# Patient Record
Sex: Male | Born: 1965 | Race: White | Hispanic: No | Smoking: Current some day smoker
Health system: Southern US, Community
[De-identification: ages and names within clinical notes are randomized; demographics above are authoritative.]

## PROBLEM LIST (undated history)

## (undated) DIAGNOSIS — F1020 Alcohol dependence, uncomplicated: Secondary | ICD-10-CM

---

## 2006-07-24 ENCOUNTER — Other Ambulatory Visit: Payer: Self-pay

## 2006-07-24 ENCOUNTER — Inpatient Hospital Stay: Payer: Self-pay | Admitting: Internal Medicine

## 2007-02-27 ENCOUNTER — Other Ambulatory Visit: Payer: Self-pay

## 2007-02-27 ENCOUNTER — Emergency Department: Payer: Self-pay | Admitting: Emergency Medicine
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
## 2008-10-30 ENCOUNTER — Inpatient Hospital Stay: Payer: Self-pay | Admitting: Internal Medicine

## 2009-12-14 IMAGING — CR DG CHEST 2V
1 series · 2 of 2 positions shown · non-contrast
Comparison: none

REASON FOR EXAM: Hypoxia
COMMENTS:

PROCEDURE:  DXR CHEST PA (OR AP) AND LATERAL  - November 01, 2008
RESULT:      PA and lateral views of the chest shows the lung fields to the
clear. No pneumonia, pneumothorax or pleural effusion is seen. The heart
size is normal.

[Series 1: view not recorded · 0.17mm/px · 2 of 2 slices shown]
[im 1/2]
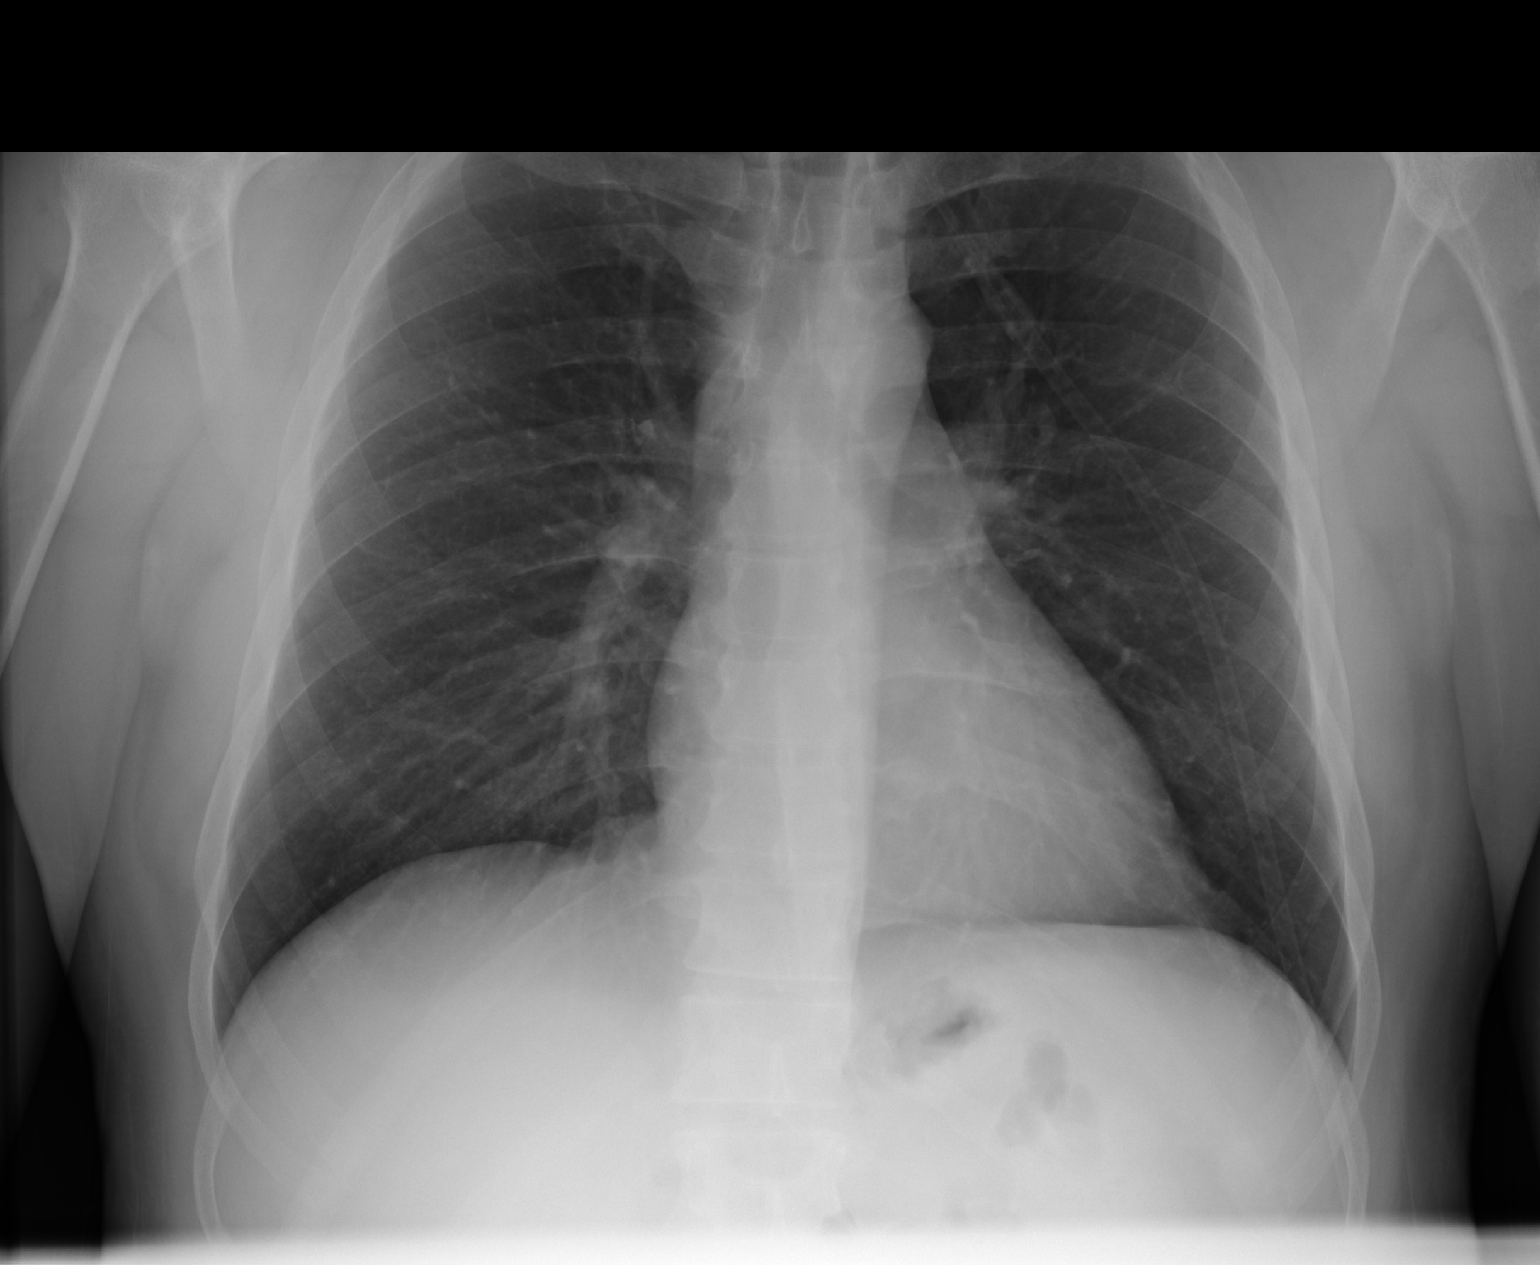
[im 2/2]
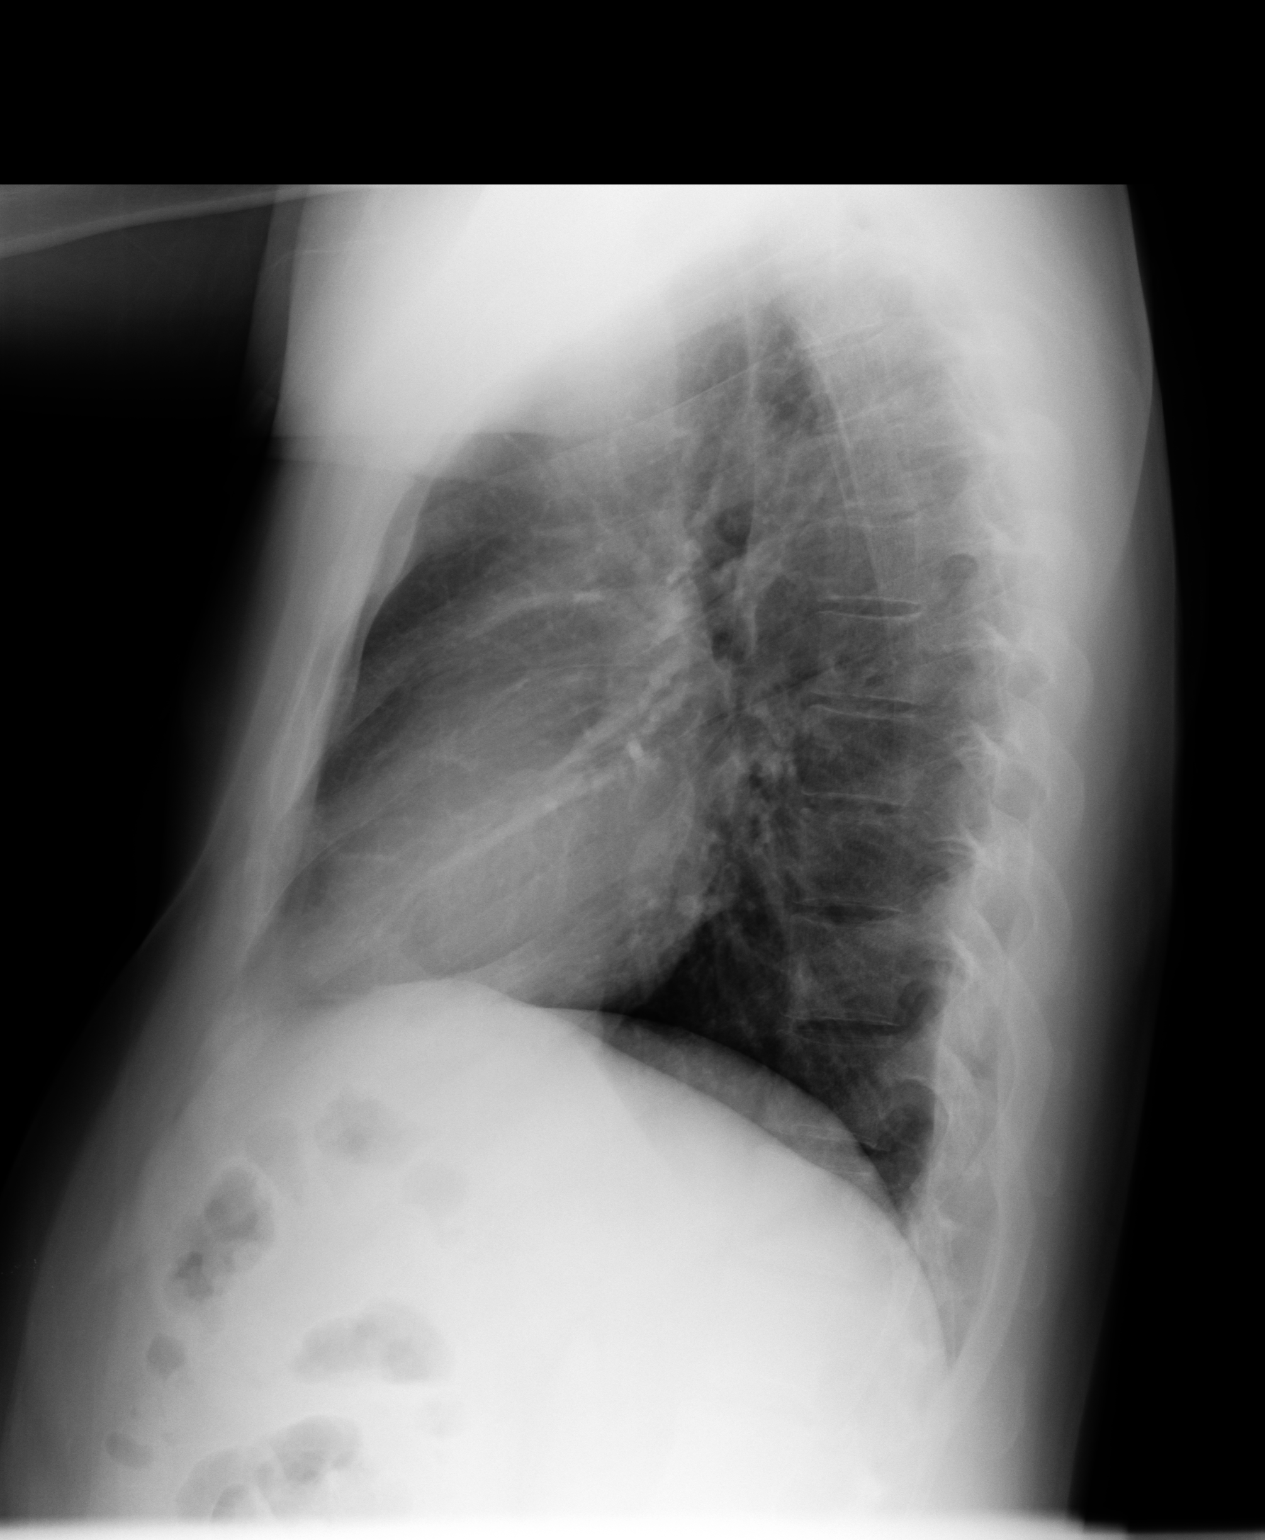

[2 of 2 positions shown; findings below may reference images not displayed]

IMPRESSION: No significant abnormalities are noted.

## 2011-07-14 ENCOUNTER — Inpatient Hospital Stay: Payer: Self-pay | Admitting: Psychiatry
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
## 2023-03-03 ENCOUNTER — Emergency Department
Admission: EM | Admit: 2023-03-03 | Discharge: 2023-03-03 | Disposition: A | Discharge status: Home / Self Care | Payer: MEDICAID | Attending: Student in an Organized Health Care Education/Training Program | Admitting: Student in an Organized Health Care Education/Training Program

## 2023-03-03 ENCOUNTER — Other Ambulatory Visit: Payer: Self-pay

## 2023-03-03 DIAGNOSIS — R45851 Suicidal ideations: Secondary | ICD-10-CM | POA: Diagnosis not present

## 2023-03-03 DIAGNOSIS — F319 Bipolar disorder, unspecified: Secondary | ICD-10-CM | POA: Diagnosis not present

## 2023-03-03 DIAGNOSIS — F17210 Nicotine dependence, cigarettes, uncomplicated: Secondary | ICD-10-CM | POA: Insufficient documentation

## 2023-03-03 DIAGNOSIS — Y908 Blood alcohol level of 240 mg/100 ml or more: Secondary | ICD-10-CM | POA: Diagnosis not present

## 2023-03-03 DIAGNOSIS — Z91148 Patient's other noncompliance with medication regimen for other reason: Secondary | ICD-10-CM | POA: Diagnosis not present

## 2023-03-03 DIAGNOSIS — F32A Depression, unspecified: Secondary | ICD-10-CM

## 2023-03-03 DIAGNOSIS — F1029 Alcohol dependence with unspecified alcohol-induced disorder: Secondary | ICD-10-CM | POA: Diagnosis present

## 2023-03-03 LAB — COMPREHENSIVE METABOLIC PANEL
ALT: 119 U/L — ABNORMAL HIGH (ref 0–44)
AST: 172 U/L — ABNORMAL HIGH (ref 15–41)
Albumin: 5.1 g/dL — ABNORMAL HIGH (ref 3.5–5.0)
Alkaline Phosphatase: 81 U/L (ref 38–126)
Anion gap: 18 — ABNORMAL HIGH (ref 5–15)
BUN: 8 mg/dL (ref 6–20)
CO2: 25 mmol/L (ref 22–32)
Calcium: 9.2 mg/dL (ref 8.9–10.3)
Chloride: 86 mmol/L — ABNORMAL LOW (ref 98–111)
Creatinine, Ser: 0.73 mg/dL (ref 0.61–1.24)
GFR, Estimated: >60 mL/min (ref >60)
Glucose, Bld: 100 mg/dL — ABNORMAL HIGH (ref 70–99)
Potassium: 3.5 mmol/L (ref 3.5–5.1)
Sodium: 129 mmol/L — ABNORMAL LOW (ref 135–145)
Total Bilirubin: 1.4 mg/dL — ABNORMAL HIGH (ref 0.3–1.2)
Total Protein: 9.1 g/dL — ABNORMAL HIGH (ref 6.5–8.1)

## 2023-03-03 LAB — LIPASE, BLOOD: Lipase: 51 U/L (ref 11–51)

## 2023-03-03 LAB — CBC
HCT: 47.7 % (ref 39.0–52.0)
Hemoglobin: 16.9 g/dL (ref 13.0–17.0)
MCH: 31.9 pg (ref 26.0–34.0)
MCHC: 35.4 g/dL (ref 30.0–36.0)
MCV: 90 fL (ref 80.0–100.0)
Platelets: 196 10*3/uL (ref 150–400)
RBC: 5.3 MIL/uL (ref 4.22–5.81)
RDW: 12.4 % (ref 11.5–15.5)
WBC: 6.7 10*3/uL (ref 4.0–10.5)
nRBC: 0 % (ref 0.0–0.2)

## 2023-03-03 LAB — URINALYSIS, ROUTINE W REFLEX MICROSCOPIC
Bacteria, UA: NONE SEEN
Bilirubin Urine: NEGATIVE
Glucose, UA: NEGATIVE mg/dL
Ketones, ur: 20 mg/dL — AB
Leukocytes,Ua: NEGATIVE
Nitrite: NEGATIVE
Protein, ur: 100 mg/dL — AB
Specific Gravity, Urine: 1.009 (ref 1.005–1.030)
pH: 6 (ref 5.0–8.0)

## 2023-03-03 LAB — ETHANOL: Alcohol, Ethyl (B): 316 mg/dL (ref <10)

## 2023-03-03 MED ORDER — ONDANSETRON 4 MG PO TBDP
4.0000 mg | ORAL_TABLET | Freq: Once | ORAL | Status: AC
  Administered 2023-03-03: 4 mg via ORAL
  Filled 2023-03-03: qty 1

## 2023-03-03 MED ORDER — LOPERAMIDE HCL 2 MG PO CAPS
2.0000 mg | ORAL_CAPSULE | Freq: Once | ORAL | Status: AC
  Administered 2023-03-03: 2 mg via ORAL
  Filled 2023-03-03: qty 1

## 2023-03-03 MED ORDER — PROMETHAZINE HCL 25 MG PO TABS
25.0000 mg | ORAL_TABLET | Freq: Once | ORAL | Status: AC
  Administered 2023-03-03: 25 mg via ORAL
  Filled 2023-03-03: qty 1

## 2023-03-03 MED ORDER — ONDANSETRON 4 MG PO TBDP: 4.0000 mg | ORAL_TABLET | Freq: Three times a day (TID) | ORAL | 0 refills | Status: AC | PRN

## 2023-03-03 MED ORDER — ALUM & MAG HYDROXIDE-SIMETH 200-200-20 MG/5ML PO SUSP
30.0000 mL | Freq: Once | ORAL | Status: AC
  Administered 2023-03-03: 30 mL via ORAL
  Filled 2023-03-03: qty 30

## 2023-03-03 MED ORDER — LORAZEPAM 2 MG PO TABS
2.0000 mg | ORAL_TABLET | Freq: Once | ORAL | Status: AC
  Administered 2023-03-03: 2 mg via ORAL
  Filled 2023-03-03: qty 1

## 2023-03-03 MED ORDER — PANTOPRAZOLE SODIUM 40 MG PO TBEC
40.0000 mg | DELAYED_RELEASE_TABLET | Freq: Once | ORAL | Status: AC
  Administered 2023-03-03: 40 mg via ORAL
  Filled 2023-03-03: qty 1

## 2023-03-03 NOTE — Discharge Instructions (Addendum)
You have been seen in the emergency department for a  psychiatric concern. You have been evaluated both medically as well as psychiatrically. Please follow-up with your outpatient resources provided. Return to the emergency department for any worsening symptoms, or any thoughts of hurting yourself or anyone else so that we may attempt to help you. 

## 2023-03-03 NOTE — Consult Note (Signed)
[facility] Face-to-Face Psychiatry Consult   Reason for Consult:  suicidal ideation Referring Physician:  Willy Eddy MD Patient Identification: Joshua Mcbride MRN:  [id number] Principal Diagnosis: <principal problem not specified> Diagnosis:  Active Problems:   Depression   Total Time spent with patient: 15 minutes  Subjective:   Joshua Mcbride is a 57 y.o. male patient admitted with suicidal ideation and alcohol use disorder. Patient reports that "I don't feel good".  HPI:  Joshua Mcbride is a 57 yo male presenting with elevated blood alcohol level and passive suicidal ideation. Patient reports that he's had thoughts of hurting himself in the past but denies a plan or intent. He denies a history of self harming behaviors.   He reports a psychiatric history of depression, bipolar disorder and anxiety. He states that he was prescribed Seroquel but has not taken his medication in the past month. "I just moved her three months ago from [place]". Patient shares the desire to seek substance abuse treatment and medication management.  Past Psychiatric History: see above  Risk to Self:  passive SI Risk to Others:  denies Prior Inpatient Therapy:  denies Prior Outpatient Therapy:  Yes  Past Medical History: History reviewed. No pertinent past medical history. History reviewed. No pertinent surgical history. Family History: History reviewed. No pertinent family history. Family Psychiatric  History: none known Social History:  Social History   Substance and Sexual Activity  Alcohol Use Yes   Comment: Pt reports 7-8 beers a day     Social History   Substance and Sexual Activity  Drug Use Never    Social History   Socioeconomic History   Marital status: Legally Separated    Spouse name: Not on file   Number of children: Not on file   Years of education: Not on file   Highest education level: Not on file  Occupational History   Not on file  Tobacco Use   Smoking  status: Some Days    Types: Cigarettes   Smokeless tobacco: Current    Types: Chew  Substance and Sexual Activity   Alcohol use: Yes    Comment: Pt reports 7-8 beers a day   Drug use: Never   Sexual activity: Not on file  Other Topics Concern   Not on file  Social History Narrative   Not on file   Social Determinants of Health   Financial Resource Strain: Not on File (10/14/2021)   Received from [employer] [facility]    Financial Resource Strain: 0  Food Insecurity: Not on File (07/24/2021)   Received from [facility] Insecurity    Food: 0  Transportation Needs: Not on File (07/24/2021)   Received from [facility] Needs    Transportation: 0  Physical Activity: Not on File (10/14/2021)   Received from [facility]   Physical Activity    Physical Activity: 0  Stress: Not on File (10/14/2021)   Received from [facility]   Stress    Stress: 0  Social Connections: Not at Risk (10/14/2021)   Received from [facility]   Social Connections    Social Connections and Isolation: 1   Additional Social History:    Allergies:  No Known Allergies  Labs:  Results for orders placed or performed during the hospital encounter of 03/03/23 (from the past 48 hour(s))  Lipase, blood     Status: None   Collection Time: 03/03/23 12:29 PM  Result Value Ref Range   Lipase 51 11 -  51 U/L    Comment: Performed at [facility], [address] [place], [place] [address]  Comprehensive metabolic panel     Status: Abnormal   Collection Time: 03/03/23 12:29 PM  Result Value Ref Range   Sodium 129 (L) 135 - 145 mmol/L   Potassium 3.5 3.5 - 5.1 mmol/L   Chloride 86 (L) 98 - 111 mmol/L   CO2 25 22 - 32 mmol/L   Glucose, Bld 100 (H) 70 - 99 mg/dL    Comment: Glucose reference range applies only to samples taken after fasting for at least 8 hours.   BUN 8 6 - 20 mg/dL   Creatinine, Ser 9.51 0.61 - 1.24 mg/dL   Calcium 9.2 8.9 - 88.4 mg/dL   Total Protein 9.1 (H) 6.5 - 8.1 g/dL    Albumin 5.1 (H) 3.5 - 5.0 g/dL   AST 166 (H) 15 - 41 U/L   ALT 119 (H) 0 - 44 U/L   Alkaline Phosphatase 81 38 - 126 U/L   Total Bilirubin 1.4 (H) 0.3 - 1.2 mg/dL   GFR, Estimated >06 >30 mL/min    Comment: (NOTE) Calculated using the CKD-EPI Creatinine Equation (2021)    Anion gap 18 (H) 5 - 15    Comment: Performed at [facility], [address] [place], [place] [address]  CBC     Status: None   Collection Time: 03/03/23 12:29 PM  Result Value Ref Range   WBC 6.7 4.0 - 10.5 K/uL   RBC 5.30 4.22 - 5.81 MIL/uL   Hemoglobin 16.9 13.0 - 17.0 g/dL   HCT 93.2 35.5 - 73.2 %   MCV 90.0 80.0 - 100.0 fL   MCH 31.9 26.0 - 34.0 pg   MCHC 35.4 30.0 - 36.0 g/dL   RDW 20.2 54.2 - 70.6 %   Platelets 196 150 - 400 K/uL   nRBC 0.0 0.0 - 0.2 %    Comment: Performed at [facility], [address] [place], [place] [address]  Ethanol     Status: Abnormal   Collection Time: 03/03/23 12:29 PM  Result Value Ref Range   Alcohol, Ethyl (B) 316 (HH) <10 mg/dL    Comment: CRITICAL RESULT CALLED TO, READ BACK BY AND VERIFIED WITH ASHLEY TREXLER AT 1331 ON 03/03/23 BY SS (NOTE) Lowest detectable limit for serum alcohol is 10 mg/dL.  For medical purposes only. Performed at [facility], [address] [place], [place] [address]   Urinalysis, Routine w reflex microscopic -Urine, Clean Catch     Status: Abnormal   Collection Time: 03/03/23 12:36 PM  Result Value Ref Range   Color, Urine YELLOW (A) YELLOW   APPearance CLEAR (A) CLEAR   Specific Gravity, Urine 1.009 1.005 - 1.030   pH 6.0 5.0 - 8.0   Glucose, UA NEGATIVE NEGATIVE mg/dL   Hgb urine dipstick SMALL (A) NEGATIVE   Bilirubin Urine NEGATIVE NEGATIVE   Ketones, ur 20 (A) NEGATIVE mg/dL   Protein, ur 761 (A) NEGATIVE mg/dL   Nitrite NEGATIVE NEGATIVE   Leukocytes,Ua NEGATIVE NEGATIVE   RBC / HPF 0-5 0 - 5 RBC/hpf   WBC, UA 0-5 0 - 5 WBC/hpf   Bacteria, UA NONE SEEN NONE SEEN   Squamous Epithelial / HPF  0-5 0 - 5 /HPF   Mucus PRESENT     Comment: Performed at [facility], [address] [place], [place] [address]    No current facility-administered medications for this encounter.  Current Outpatient Medications  Medication Sig Dispense Refill   ondansetron (ZOFRAN-ODT) 4 MG disintegrating tablet Take 1 tablet (4 mg total) by mouth every 8 (eight) hours as needed for nausea or vomiting. 8 tablet 0    Musculoskeletal: Strength & Muscle Tone: within normal limits Gait & Station: normal Patient leans: N/A            Psychiatric Specialty Exam:  Presentation  General Appearance: Appropriate for Environment  Eye Contact:Good  Speech:Clear and Coherent  Speech Volume:Normal  Handedness:No data recorded  Mood and Affect  Mood:Depressed  Affect:Congruent   Thought Process  Thought Processes:Coherent  Descriptions of Associations:Intact  Orientation:Full (Time, Place and Person)  Thought Content:Logical  History of Schizophrenia/Schizoaffective disorder:No  Duration of Psychotic Symptoms:Less than six months  Hallucinations:Hallucinations: None  Ideas of Reference:None  Suicidal Thoughts:Suicidal Thoughts: Yes, Passive SI Passive Intent and/or Plan: Without Intent; Without Plan  Homicidal Thoughts:Homicidal Thoughts: No   Sensorium  Memory:Immediate Good; Remote Good; Recent Good  Judgment:Good  Insight:Good   Executive Functions  Concentration:Good  Attention Span:Good  Recall:Good  Fund of Knowledge:Good  Language:Good   Psychomotor Activity  Psychomotor Activity:Psychomotor Activity: Normal   Assets  Assets:Communication Skills   Sleep  Sleep:Sleep: Fair   Physical Exam: Physical Exam Vitals reviewed.  Neurological:     Mental Status: He is oriented to person, place, and time.    Review of Systems  Psychiatric/Behavioral:  Positive for depression, substance abuse and suicidal ideas. Negative for  hallucinations and memory loss. The patient is not nervous/anxious.   All other systems reviewed and are negative.  Blood pressure (!) 157/92, pulse (!) 101, temperature 97.8 F (36.6 C), temperature source Oral, resp. rate 18, height 5\' 9"  (1.753 m), weight 72.6 kg, SpO2 98%. Body mass index is 23.63 kg/m.  Treatment Plan Summary: Plan Patient in agreement with accepting community resources for follow up with substance abuse treatment and outpatient psychiatry. Patient clear and coherent despite elevated blood alcohol level. He reports a hx of passive SI but not plan or intentions in the past or currently. Patient is cleared psychiatrically and may discharge when medically cleared.   Disposition: No evidence of imminent risk to self or others at present.   Patient does not meet criteria for psychiatric inpatient admission. Supportive therapy provided about ongoing stressors. Discussed crisis plan, support from social network, calling 911, coming to the Emergency Department, and calling Suicide Hotline.  Mcneil Sober, NP 03/03/2023 6:24 PM

## 2023-03-03 NOTE — BH Assessment (Addendum)
Comprehensive Clinical Assessment (CCA) Note  03/03/2023 Joshua Mcbride [id number]  Chief Complaint:  Chief Complaint  Patient presents with   Abdominal Pain   Alcohol Problem   Visit Diagnosis:   F31.32 Bipolar I disorder, Current or most recent episode depressed, Moderate F10.20 Alcohol use disorder, Severe  Flowsheet Row ED from 03/03/2023 in [facility] Emergency Department at [facility]  C-SSRS RISK CATEGORY Low Risk      The patient demonstrates the following risk factors for suicide: Chronic risk factors for suicide include: psychiatric disorder of bipolar disorder, major depressive disorder, substance use disorder, medical illness ulcers, and chronic pain. Acute risk factors for suicide include: family or marital conflict and social withdrawal/isolation. Protective factors for this patient include: positive social support, positive therapeutic relationship, responsibility to others (children, family), coping skills, and hope for the future. Considering these factors, the overall suicide risk at this point appears to be low. Patient is not appropriate for outpatient follow up.  Disposition: C. Penn NP, recommends patient to be psychiatric cleared and will follow up  with [facility] Counseling Services..  Disposition discussed with Sports administrator.  RN to discuss with EDP.  Joshua Mcbride is a male who presents voluntarily to [facility] and unaccompanied.  Pt reports he has a history of bipolar disorder and has been feeling increasing depressed for the past month.  Pt denies SI, "If it get me down stairs, I will say I am suicidal".  Pt later stated that he had past SI, with a plan to cut himself  with a pocket knife.  Pt reports seeing black dots floating around.  Pt acknowledged the following symptoms: irritable, sadness, fatigue, loneliness, anxious, racing thoughts, high's and low's, "the lowe's are lasting about a month".  Pt reports he has not ate in four  days, unable to identify weight lost.  Pt reports that he is sleeping three hours during the night, "I reached for the alcohol bottle to help me sleep".  Pt says he has been drinking alcohol daily and denies any other substance use.    Pt identifies his primary stressor as with not taking his medication, "I move to [place] a month ago; also reports that he separate from his wife, (10 year relationship).  Pt reports that he is currently living with friends and his father, which is his support person.  Pt reports family history of mental illness; also reports a family history of substance used.  Pt denies any history of abuse or trauma.  Pt denies any current legal problems.  Pt reports no guns in the home.  Pt says he is not receiving weekly outpatient therapy; however, reported that he was receiving outpatient medication management when he was in [place], [place].  Pt reports no previous inpatient psychiatric hospitalizations.  Pt is dressed in scrubs, alert,oriented x 4 with normal speech and restless motor behavior.  Eye contact is good.  Pt's mood is depressed and affect is anxious.  Thought process relevant.  Pt's insight is lacking and judgment is impaired. There is no indication Pt is currently responding to internal stimuli or experiencing delusional thought content.  Pt was cooperative throughout assessment.   CCA Screening, Triage and Referral (STR)  Patient Reported Information How did you hear about [place]? Family/Friend  What Is the Reason for Your Visit/Call Today? Bipolar, Alcohol  How Long Has This Been Causing You Problems? 1 wk - 1 month  What Do You Feel Would Help You the Most Today? Alcohol or  Drug Use Treatment; Treatment for Depression or other mood problem; Stress Management; Medication(s)   Have You Recently Had Any Thoughts About Hurting Yourself? No  Are You Planning to Commit Suicide/Harm Yourself At This time? No   Flowsheet Row ED from 03/03/2023 in [facility]  Emergency Department at [facility]  C-SSRS RISK CATEGORY Low Risk       Have you Recently Had Thoughts About Hurting Someone Karolee Ohs? No  Are You Planning to Harm Someone at This Time? No  Explanation: n/a   Have You Used Any Alcohol or Drugs in the Past 24 Hours? Yes  What Did You Use and How Much? 2 Beer   Do You Currently Have a Therapist/Psychiatrist? No  Name of Therapist/Psychiatrist: Name of Therapist/Psychiatrist: n/a   Have You Been Recently Discharged From Any Office Practice or Programs? No  Explanation of Discharge From Practice/Program: n/a     CCA Screening Triage Referral Assessment Type of Contact: Face-to-Face  Telemedicine Service Delivery:   Is this Initial or Reassessment?   Date Telepsych consult ordered in [facility]:    Time Telepsych consult ordered in [facility]:    Location of Assessment: [facility] ED  Provider Location: [facility] ED   Collateral Involvement: No collateraled.   Does Patient Have a Automotive engineer Guardian? No  Legal Guardian Contact Information: n/a  Copy of Legal Guardianship Form: -- (n/a)  Legal Guardian Notified of Arrival: -- (n/a)  Legal Guardian Notified of Pending Discharge: -- (n/a)  If Minor and Not Living with Parent(s), Who has Custody? n/a  Is CPS involved or ever been involved? Never  Is APS involved or ever been involved? Never   Patient Determined To Be At Risk for Harm To Self or Others Based on Review of Patient Reported Information or Presenting Complaint? No (Pt reports "I will say yes, to self harm if it meant for me to go down stairs")  Method: No Plan  Availability of Means: No access or NA  Intent: Vague intent or NA  Notification Required: No need or identified person  Additional Information for Danger to Others Potential: -- (n/a)  Additional Comments for Danger to Others Potential: n/a  Are There Guns or Other Weapons in Your Home? No  Types of Guns/Weapons: No guns in the home  Are  These Weapons Safely Secured?                            -- (n/a)  Who Could Verify You Are Able To Have These Secured: n/a  Do You Have any Outstanding Charges, Pending Court Dates, Parole/Probation? No  Contacted To Inform of Risk of Harm To Self or Others: -- (n/a)    Does Patient Present under Involuntary Commitment? No    [place] of Residence: [place]   Patient Currently Receiving the Following Services: Medication Management   Determination of Need: Urgent (48 hours)   Options For Referral: Inpatient Hospitalization (C.Penn, recommended overnight observation, and continuous monitoring.)     CCA Biopsychosocial Patient Reported Schizophrenia/Schizoaffective Diagnosis in Past: No   Strengths: Asking for help   Mental Health Symptoms Depression:   Irritability; Weight gain/loss; Worthlessness   Duration of Depressive symptoms:  Duration of Depressive Symptoms: Greater than two weeks   Mania:   Increased Energy; Change in energy/activity; Racing thoughts; Recklessness   Anxiety:    Restlessness; Irritability; Sleep; Tension   Psychosis:   Delusions   Duration of Psychotic symptoms:  Duration of Psychotic  Symptoms: Less than six months   Trauma:   None   Obsessions:   Disrupts routine/functioning; Cause anxiety; Recurrent & persistent thoughts/impulses/images   Compulsions:   "Driven" to perform behaviors/acts; Repeated behaviors/mental acts   Inattention:   None   Hyperactivity/Impulsivity:   None   Oppositional/Defiant Behaviors:   None   Emotional Irregularity:   Chronic feelings of emptiness; Potentially harmful impulsivity   Other Mood/Personality Symptoms:   Adjustment/Irritable    Mental Status Exam Appearance and self-care  Stature:   Average   Weight:   Average weight   Clothing:   -- (Pt dressed in scrubs.)   Grooming:   Normal   Cosmetic use:   Age appropriate   Posture/gait:   Normal   Motor activity:    Slowed; Restless   Sensorium  Attention:   Normal   Concentration:   Normal   Orientation:   Object; Person; Situation; Place; Time   Recall/memory:   Normal   Affect and Mood  Affect:   Depressed   Mood:   Depressed; Worthless; Irritable   Relating  Eye contact:   None   Facial expression:   Responsive; Sad; Tense   Attitude toward examiner:   Cooperative   Thought and Language  Speech flow:  Normal   Thought content:   Appropriate to Mood and Circumstances   Preoccupation:   None   Hallucinations:   Visual; Other (Comment) (Pt reports he sees dark dots.)   Organization:   Coherent   [employer] of Knowledge:   Fair   Intelligence:   Average   Abstraction:   Normal   Judgement:   Impaired   Reality Testing:   Variable   Insight:   Lacking; Fair   Decision Making:   Vacilates   Social Functioning  Social Maturity:   Isolates   Social Judgement:   Impropriety   Stress  Stressors:   Illness; Relationship   Coping Ability:   Deficient supports; Overwhelmed   Skill Deficits:   Decision making; Self-care; Self-control; Responsibility   Supports:   Support needed; Family     Religion: Religion/Spirituality Are You A Religious Person?: No How Might This Affect Treatment?: did not assessed  Leisure/Recreation: Leisure / Recreation Do You Have Hobbies?: Yes Leisure and Hobbies: Therapist, music, Basketball  Exercise/Diet: Exercise/Diet Have You Gained or Lost A Significant Amount of Weight in the Past Six Months?: Yes-Lost Number of Pounds Lost?:  (Pt unable to identify the weight lost.) Do You Follow a Special Diet?: No Do You Have Any Trouble Sleeping?: Yes Explanation of Sleeping Difficulties: Pt reports sleeping three hours during the night.   CCA Employment/Education Employment/Work Situation: Employment / Work Situation Employment Situation: Employed Work Stressors: n/a Patient's Job has Been  Impacted by Current Illness: Yes Describe how Patient's Job has Been Impacted: Pt reports he have not taken prescribed medication since Dec 11, 2022 Has Patient ever Been in the [employer]?: No  Education: Education Is Patient Currently Attending School?: No Last Grade Completed: 12 Did You Attend College?: No Did You Have An Individualized Education Program (IIEP): No Did You Have Any Difficulty At School?: No Patient's Education Has Been Impacted by Current Illness: No   CCA Family/Childhood History Family and Relationship History: Family history Marital status: Married Number of Years Married: 10 What types of issues is patient dealing with in the relationship?: Marital conflict, "I left my wife" Additional relationship information: n/a Does patient have children?: Yes How many children?: 6 How  is patient's relationship with their children?: distance  Childhood History:  Childhood History By whom was/is the patient raised?: Mother Did patient suffer any verbal/emotional/physical/sexual abuse as a child?: No Did patient suffer from severe childhood neglect?: No Has patient ever been sexually abused/assaulted/raped as an adolescent or adult?: No Was the patient ever a victim of a crime or a disaster?: No Witnessed domestic violence?: No Has patient been affected by domestic violence as an adult?: No       CCA Substance Use Alcohol/Drug Use: Alcohol / Drug Use Pain Medications: See MRA Prescriptions: See MRA Over the Counter: See MRA History of alcohol / drug use?: Yes Longest period of sobriety (when/how long): 8 years of sobriety Negative Consequences of Use: Financial, Personal relationships Withdrawal Symptoms: Agitation, DTs, Irritability, Tremors Substance #1 Name of Substance 1: Alcohol 1 - Age of First Use: 17 1 - Amount (size/oz): 2 bears 1 - Frequency: daily 1 - Duration: ongoing 1 - Last Use / Amount: 03/03/23 1 - Method of Aquiring: UTA 1- Route of  Use: oral                       ASAM's:  Six Dimensions of Multidimensional Assessment  Dimension 1:  Acute Intoxication and/or Withdrawal Potential:   Dimension 1:  Description of individual's past and current experiences of substance use and withdrawal: Pt reports drinking alchol at the age of 57 year old, "I drink daily and I am experiencing shakes and tremors".  Dimension 2:  Biomedical Conditions and Complications:   Dimension 2:  Description of patient's biomedical conditions and  complications: Ulcers  Dimension 3:  Emotional, Behavioral, or Cognitive Conditions and Complications:  Dimension 3:  Description of emotional, behavioral, or cognitive conditions and complications: Bipolar, Depression, Anxiety  Dimension 4:  Readiness to Change:  Dimension 4:  Description of Readiness to Change criteria: contemplation  Dimension 5:  Relapse, Continued use, or Continued Problem Potential:  Dimension 5:  Relapse, continued use, or continued problem potential critiera description: Relapse, continued to used.  Dimension 6:  Recovery/Living Environment:  Dimension 6:  Recovery/Iiving environment criteria description: Pt reports that he is living in a safe place with friends  ASAM Severity Score: ASAM's Severity Rating Score: 15  ASAM Recommended Level of Treatment: ASAM Recommended Level of Treatment: Level I Outpatient Treatment   Substance use Disorder (SUD) Substance Use Disorder (SUD)  Checklist Symptoms of Substance Use: Continued use despite having a persistent/recurrent physical/psychological problem caused/exacerbated by use, Continued use despite persistent or recurrent social, interpersonal problems, caused or exacerbated by use, Evidence of tolerance, Persistent desire or unsuccessful efforts to cut down or control use, Presence of craving or strong urge to use, Recurrent use that results in a failure to fulfill major role obligations (work, school, home), Repeated use in physically  hazardous situations  Recommendations for Services/Supports/Treatments: Recommendations for Services/Supports/Treatments Recommendations For Services/Supports/Treatments: Inpatient Hospitalization (C. Penn NP, recommended overnight observation)  Discharge Disposition:    DSM5 Diagnoses: There are no problems to display for this patient.    Referrals to Alternative Service(s): Referred to Alternative Service(s):   Place:   Date:   Time:    Referred to Alternative Service(s):   Place:   Date:   Time:    Referred to Alternative Service(s):   Place:   Date:   Time:    Referred to Alternative Service(s):   Place:   Date:   Time:     Meryle Ready, Counselor

## 2023-03-03 NOTE — ED Provider Notes (Addendum)
[facility] Provider Note    Event Date/Time   First MD Initiated Contact with Patient 03/03/23 1300     (approximate)   History   Abdominal Pain   HPI  Joshua Mcbride is a 57 y.o. male extensive history of depression and alcohol dependence previously sober for 8 years presents to the ER requesting help with obtaining sobriety. 15.  She daily and has been feeling down and depressed related to his drinking.  States that he passive thoughts of wanting to harm himself but denies any suicidal ideation denies any intent for self-harm or thoughts of harming himself currently.  States he does have nausea and diarrhea.  Denies any pain.     Physical Exam   Triage Vital Signs: ED Triage Vitals  Encounter Vitals Group     BP 03/03/23 1226 (!) 157/92     Systolic BP Percentile --      Diastolic BP Percentile --      Pulse Rate 03/03/23 1226 (!) 101     Resp 03/03/23 1226 18     Temp 03/03/23 1226 97.8 F (36.6 C)     Temp Source 03/03/23 1226 Oral     SpO2 03/03/23 1226 98 %     Weight 03/03/23 1227 160 lb (72.6 kg)     Height 03/03/23 1227 5\' 9"  (1.753 m)     Head Circumference --      Peak Flow --      Pain Score 03/03/23 1234 8     Pain Loc --      Pain Education --      Exclude from Growth Chart --     Most recent vital signs: Vitals:   03/03/23 1226  BP: (!) 157/92  Pulse: (!) 101  Resp: 18  Temp: 97.8 F (36.6 C)  SpO2: 98%     Constitutional: Alert  Eyes: Conjunctivae are normal.  Head: Atraumatic. Nose: No congestion/rhinnorhea. Mouth/Throat: Mucous membranes are moist.   Neck: Painless ROM.  Cardiovascular:   Good peripheral circulation. Respiratory: Normal respiratory effort.  No retractions.  Gastrointestinal: Soft and nontender.  Musculoskeletal:  no deformity Neurologic:  MAE spontaneously. No gross focal neurologic deficits are appreciated.  Skin:  Skin is warm, dry and intact. No rash noted. Psychiatric: Mood and  affect are normal. Speech and behavior are normal.    ED Results / Procedures / Treatments   Labs (all labs ordered are listed, but only abnormal results are displayed) Labs Reviewed  COMPREHENSIVE METABOLIC PANEL - Abnormal; Notable for the following components:      Result Value   Sodium 129 (*)    Chloride 86 (*)    Glucose, Bld 100 (*)    Total Protein 9.1 (*)    Albumin 5.1 (*)    AST 172 (*)    ALT 119 (*)    Total Bilirubin 1.4 (*)    Anion gap 18 (*)    All other components within normal limits  ETHANOL - Abnormal; Notable for the following components:   Alcohol, Ethyl (B) 316 (*)    All other components within normal limits  LIPASE, BLOOD  CBC  URINALYSIS, ROUTINE W REFLEX MICROSCOPIC     EKG     RADIOLOGY    PROCEDURES:  Critical Care performed:   Procedures   MEDICATIONS ORDERED IN ED: Medications  promethazine (PHENERGAN) tablet 25 mg (has no administration in time range)  pantoprazole (PROTONIX) EC tablet 40 mg (has no administration in  time range)  ondansetron (ZOFRAN-ODT) disintegrating tablet 4 mg (4 mg Oral Given 03/03/23 1324)  loperamide (IMODIUM) capsule 2 mg (2 mg Oral Given 03/03/23 1324)     IMPRESSION / MDM / ASSESSMENT AND PLAN / ED COURSE  I reviewed the triage vital signs and the nursing notes.                              Differential diagnosis includes, but is not limited to, Psychosis, delirium, medication effect, noncompliance, polysubstance abuse, Si, Hi, depression, pancreatitis, dehydration, gastritis Patient presenting to the ER for evaluation of symptoms as described above.  Based on symptoms, risk factors and considered above differential, this presenting complaint could reflect a potentially life-threatening illness therefore the patient will be placed on continuous pulse oximetry and telemetry for monitoring.  Laboratory evaluation will be sent to evaluate for the above complaints.      Clinical Course as of  03/03/23 1441  Thu Mar 03, 2023  1411 Blood work consistent with history of alcohol consumption.  Does not appear clinically intoxicated.  He is agreeing to stay for consultation with psychiatry.  Does not meet criteria for IVC. [PR]    Clinical Course User Index [PR] Willy Eddy, MD    FINAL CLINICAL IMPRESSION(S) / ED DIAGNOSES   Final diagnoses:  Depression, unspecified depression type  Alcohol dependence with unspecified alcohol-induced disorder (HCC)     Rx / DC Orders   ED Discharge Orders     None        Note:  This document was prepared using Dragon voice recognition software and may include unintentional dictation errors.    Willy Eddy, MD 03/03/23 1441    Willy Eddy, MD 03/03/23 1610    Willy Eddy, MD 03/03/23 [phone]

## 2023-03-03 NOTE — ED Triage Notes (Signed)
First Nurse: Pt here via [facility] from home with abd pain. Pt has not been eating, having abd pain, and rectal bleeding x1 week. Pt has a hx of ulcers and htn, takes lisinopril.  170/102 100 99% RA

## 2023-03-03 NOTE — ED Provider Notes (Signed)
-----------------------------------------   5:21 PM on 03/03/2023 ----------------------------------------- Patient has been seen and evaluated by psychiatry.  They believe the patient is safe for discharge home from a psychiatric standpoint.  Patient's blood alcohol was elevated 316 upon arrival this was over 5 hours ago.  Patient is awake alert talking he did get Ativan for some mild withdrawal type symptoms just recently as he states he can get pretty bad alcohol withdrawal.  Psychiatry has provided the patient outpatient resources.  He is requesting to go home he is trying to find a ride home currently.   Minna Antis, MD 03/03/23 [phone]
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
## 2023-04-27 ENCOUNTER — Encounter: Payer: Self-pay | Admitting: Internal Medicine

## 2023-04-27 ENCOUNTER — Other Ambulatory Visit: Payer: Self-pay

## 2023-04-27 ENCOUNTER — Emergency Department
Admission: EM | Admit: 2023-04-27 | Discharge: 2023-04-27 | Disposition: A | Discharge status: Home / Self Care | Payer: MEDICAID | Attending: Emergency Medicine | Admitting: Emergency Medicine

## 2023-04-27 DIAGNOSIS — F1020 Alcohol dependence, uncomplicated: Secondary | ICD-10-CM | POA: Insufficient documentation

## 2023-04-27 DIAGNOSIS — F17200 Nicotine dependence, unspecified, uncomplicated: Secondary | ICD-10-CM | POA: Insufficient documentation

## 2023-04-27 DIAGNOSIS — R21 Rash and other nonspecific skin eruption: Secondary | ICD-10-CM

## 2023-04-27 DIAGNOSIS — L03119 Cellulitis of unspecified part of limb: Secondary | ICD-10-CM | POA: Diagnosis not present

## 2023-04-27 DIAGNOSIS — A419 Sepsis, unspecified organism: Secondary | ICD-10-CM | POA: Diagnosis not present

## 2023-04-27 DIAGNOSIS — I10 Essential (primary) hypertension: Secondary | ICD-10-CM | POA: Diagnosis not present

## 2023-04-27 DIAGNOSIS — Z720 Tobacco use: Secondary | ICD-10-CM | POA: Insufficient documentation

## 2023-04-27 DIAGNOSIS — T148XXA Other injury of unspecified body region, initial encounter: Secondary | ICD-10-CM

## 2023-04-27 DIAGNOSIS — F32A Depression, unspecified: Secondary | ICD-10-CM | POA: Diagnosis present

## 2023-04-27 DIAGNOSIS — L105 Drug-induced pemphigus: Secondary | ICD-10-CM | POA: Diagnosis not present

## 2023-04-27 DIAGNOSIS — K121 Other forms of stomatitis: Secondary | ICD-10-CM | POA: Insufficient documentation

## 2023-04-27 DIAGNOSIS — K1379 Other lesions of oral mucosa: Secondary | ICD-10-CM

## 2023-04-27 HISTORY — DX: Alcohol dependence, uncomplicated: F10.20

## 2023-04-27 LAB — URINALYSIS, W/ REFLEX TO CULTURE (INFECTION SUSPECTED)
Bilirubin Urine: NEGATIVE
Glucose, UA: NEGATIVE mg/dL
Ketones, ur: 80 mg/dL — AB
Nitrite: NEGATIVE
Protein, ur: 30 mg/dL — AB
Specific Gravity, Urine: 1.02 (ref 1.005–1.030)
pH: 5 (ref 5.0–8.0)

## 2023-04-27 LAB — COMPREHENSIVE METABOLIC PANEL WITH GFR
ALT: 61 U/L — ABNORMAL HIGH (ref 0–44)
AST: 86 U/L — ABNORMAL HIGH (ref 15–41)
Albumin: 4.1 g/dL (ref 3.5–5.0)
Alkaline Phosphatase: 63 U/L (ref 38–126)
Anion gap: 18 — ABNORMAL HIGH (ref 5–15)
BUN: 9 mg/dL (ref 6–20)
CO2: 17 mmol/L — ABNORMAL LOW (ref 22–32)
Calcium: 8.7 mg/dL — ABNORMAL LOW (ref 8.9–10.3)
Chloride: 93 mmol/L — ABNORMAL LOW (ref 98–111)
Creatinine, Ser: 0.79 mg/dL (ref 0.61–1.24)
GFR, Estimated: >60 mL/min (ref >60)
Glucose, Bld: 113 mg/dL — ABNORMAL HIGH (ref 70–99)
Potassium: 3.3 mmol/L — ABNORMAL LOW (ref 3.5–5.1)
Sodium: 128 mmol/L — ABNORMAL LOW (ref 135–145)
Total Bilirubin: 1.1 mg/dL (ref 0.3–1.2)
Total Protein: 7.8 g/dL (ref 6.5–8.1)

## 2023-04-27 LAB — CBC WITH DIFFERENTIAL/PLATELET
Abs Immature Granulocytes: 0.03 10*3/uL (ref 0.00–0.07)
Basophils Absolute: 0 10*3/uL (ref 0.0–0.1)
Basophils Relative: 1 %
Eosinophils Absolute: 0.3 10*3/uL (ref 0.0–0.5)
Eosinophils Relative: 5 %
HCT: 40.8 % (ref 39.0–52.0)
Hemoglobin: 13.8 g/dL (ref 13.0–17.0)
Immature Granulocytes: 1 %
Lymphocytes Relative: 12 %
Lymphs Abs: 0.6 10*3/uL — ABNORMAL LOW (ref 0.7–4.0)
MCH: 31 pg (ref 26.0–34.0)
MCHC: 33.8 g/dL (ref 30.0–36.0)
MCV: 91.7 fL (ref 80.0–100.0)
Monocytes Absolute: 0.7 10*3/uL (ref 0.1–1.0)
Monocytes Relative: 14 %
Neutro Abs: 3.4 10*3/uL (ref 1.7–7.7)
Neutrophils Relative %: 67 %
Platelets: 69 10*3/uL — ABNORMAL LOW (ref 150–400)
RBC: 4.45 MIL/uL (ref 4.22–5.81)
RDW: 12.9 % (ref 11.5–15.5)
WBC: 4.9 10*3/uL (ref 4.0–10.5)
nRBC: 0.4 % — ABNORMAL HIGH (ref 0.0–0.2)

## 2023-04-27 LAB — CHLAMYDIA/NGC RT PCR (ARMC ONLY)
Chlamydia Tr: NOT DETECTED
N gonorrhoeae: NOT DETECTED

## 2023-04-27 LAB — C-REACTIVE PROTEIN: CRP: 27.9 mg/dL — ABNORMAL HIGH (ref <1.0)

## 2023-04-27 LAB — LACTIC ACID, PLASMA
Lactic Acid, Venous: 2.7 mmol/L (ref 0.5–1.9)
Lactic Acid, Venous: 1.6 mmol/L (ref 0.5–1.9)

## 2023-04-27 LAB — SEDIMENTATION RATE: Sed Rate: 35 mm/h — ABNORMAL HIGH (ref 0–20)

## 2023-04-27 MED ORDER — LIDOCAINE VISCOUS HCL 2 % MT SOLN
15.0000 mL | Freq: Once | OROMUCOSAL | Status: AC
  Administered 2023-04-27: 15 mL via OROMUCOSAL
  Filled 2023-04-27: qty 15

## 2023-04-27 MED ORDER — CEFAZOLIN SODIUM-DEXTROSE 1-4 GM/50ML-% IV SOLN
1.0000 g | Freq: Once | INTRAVENOUS | Status: AC
  Administered 2023-04-27: 1 g via INTRAVENOUS
  Filled 2023-04-27: qty 50

## 2023-04-27 MED ORDER — MORPHINE SULFATE (PF) 4 MG/ML IV SOLN
4.0000 mg | Freq: Once | INTRAVENOUS | Status: AC
  Administered 2023-04-27: 4 mg via INTRAVENOUS
  Filled 2023-04-27: qty 1

## 2023-04-27 MED ORDER — ONDANSETRON HCL 4 MG/2ML IJ SOLN
INTRAMUSCULAR | Status: AC
  Filled 2023-04-27: qty 2

## 2023-04-27 MED ORDER — MAGIC MOUTHWASH
10.0000 mL | Freq: Three times a day (TID) | ORAL | Status: DC | PRN
  Administered 2023-04-27: 10 mL via ORAL
  Filled 2023-04-27 (×2): qty 10

## 2023-04-27 MED ORDER — SODIUM CHLORIDE 0.9 % IV BOLUS
1000.0000 mL | Freq: Once | INTRAVENOUS | Status: AC
  Administered 2023-04-27: 1000 mL via INTRAVENOUS

## 2023-04-27 MED ORDER — LORAZEPAM 2 MG/ML IJ SOLN: 2.0000 mg | INTRAMUSCULAR | Status: DC | PRN

## 2023-04-27 MED ORDER — LIDOCAINE VISCOUS HCL 2 % MT SOLN: 15.0000 mL | Freq: Four times a day (QID) | OROMUCOSAL | Status: DC | PRN

## 2023-04-27 NOTE — ED Provider Notes (Addendum)
[facility] Ed Provider Note    Event Date/Time   First MD Initiated Contact with Patient 04/27/23 1508     (approximate)   History   Rash and Blister   HPI  Joshua Mcbride is a 57 y.o. male   Past medical history of hypertension and smoker who presents to the emergency department with rash and blistering with pain.  Also ulcerations in his mouth and pain in his mouth.  Developed over the last several days.  He states that he has a similar rash that breaks out every couple of years managed at outside hospital (he is new to this area) and attributed to an old Lyme's disease that he got in 2011.  He states that every couple of years he has redness and blistering to his hands that is managed with antibiotics and resolves.  This time around however involves both of his feet, his torso, his face and his mouth.  They are painful and very mildly itchy.  He denies fever.  He has trouble eating due to the pain in his mouth.  He denies shortness of breath or chest pain.  No abdominal pain nausea vomiting diarrhea.  No urinary symptoms.  He has no new exposures, no new medications has been on lisinopril for many years, denies injection drug use.  He states that he was managed by his general doctor and has never seen in the infectious disease specialist nor rheumatology  Independent Historian contributed to assessment above: His wife is at bedside corroborates information past medical history as above  Physical Exam   Triage Vital Signs: ED Triage Vitals [04/27/23 1246]  Encounter Vitals Group     BP (!) 162/92     Systolic BP Percentile      Diastolic BP Percentile      Pulse Rate (!) 102     Resp 16     Temp 98.5 F (36.9 C)     Temp Source Oral     SpO2 96 %     Weight 150 lb (68 kg)     Height 5\' 9"  (1.753 m)     Head Circumference      Peak Flow      Pain Score 10     Pain Loc      Pain Education      Exclude from Growth Chart     Most recent  vital signs: Vitals:   04/27/23 1600 04/27/23 1630  BP: (!) 152/85 (!) 162/91  Pulse: 100 (!) 102  Resp: 14 14  Temp:    SpO2: 98% 98%    General: Awake, no distress.  CV:  Good peripheral perfusion.  Resp:  Normal effort.  Abd:  No distention.  Other:  He has a diffuse erythematous rash to his face, neck, bilateral hands, and lower extremities from the knee down.  He has large blisters on his hands and his feet that are Nikolsky negative.  He has white plaques on his mouth but no significant intraoral swelling.  His lungs are clear his abdomen soft and nontender and there is no rash of significance to his torso or back.   ED Results / Procedures / Treatments   Labs (all labs ordered are listed, but only abnormal results are displayed) Labs Reviewed  CBC WITH DIFFERENTIAL/PLATELET - Abnormal; Notable for the following components:      Result Value   Platelets 69 (*)    nRBC 0.4 (*)    Lymphs Abs 0.6 (*)  All other components within normal limits  COMPREHENSIVE METABOLIC PANEL - Abnormal; Notable for the following components:   Sodium 128 (*)    Potassium 3.3 (*)    Chloride 93 (*)    CO2 17 (*)    Glucose, Bld 113 (*)    Calcium 8.7 (*)    AST 86 (*)    ALT 61 (*)    Anion gap 18 (*)    All other components within normal limits  LACTIC ACID, PLASMA - Abnormal; Notable for the following components:   Lactic Acid, Venous 2.7 (*)    All other components within normal limits  SEDIMENTATION RATE - Abnormal; Notable for the following components:   Sed Rate 35 (*)    All other components within normal limits  CHLAMYDIA/NGC RT PCR ([facility] ONLY)            CULTURE, BLOOD (ROUTINE X 2)  CULTURE, BLOOD (ROUTINE X 2)  URINALYSIS, W/ REFLEX TO CULTURE (INFECTION SUSPECTED)  RPR  HIV ANTIBODY (ROUTINE TESTING W REFLEX)  ROCKY MTN SPOTTED FVR ABS PNL(IGG+IGM)  LYME DISEASE SEROLOGY W/REFLEX  C-REACTIVE PROTEIN  LACTIC ACID, PLASMA     I ordered and reviewed the above labs  they are notable for hyponatremia 128, mildly elevated LFTs but decreased from prior testing in July 2024, platelets are 69.    PROCEDURES:  Critical Care performed: Yes, see critical care procedure note(s)  .Critical Care  Performed by: Pilar Jarvis, MD Authorized by: Pilar Jarvis, MD   Critical care provider statement:    Critical care time (minutes):  30   Critical care was time spent personally by me on the following activities:  Development of treatment plan with patient or surrogate, discussions with consultants, evaluation of patient's response to treatment, examination of patient, ordering and review of laboratory studies, ordering and review of radiographic studies, ordering and performing treatments and interventions, pulse oximetry, re-evaluation of patient's condition and review of old charts    MEDICATIONS ORDERED IN ED: Medications  magic mouthwash (10 mLs Oral Given 04/27/23 1623)  morphine (PF) 4 MG/ML injection 4 mg (has no administration in time range)  lidocaine (XYLOCAINE) 2 % viscous mouth solution 15 mL (has no administration in time range)  sodium chloride 0.9 % bolus 1,000 mL (1,000 mLs Intravenous New Bag/Given 04/27/23 1601)  ceFAZolin (ANCEF) IVPB 1 g/50 mL premix (0 g Intravenous Stopped 04/27/23 1700)  sodium chloride 0.9 % bolus 1,000 mL (1,000 mLs Intravenous New Bag/Given 04/27/23 1703)    External physician / consultants:  I spoke with hospitalist for admission and regarding care plan for this patient.   IMPRESSION / MDM / ASSESSMENT AND PLAN / ED COURSE  I reviewed the triage vital signs and the nursing notes.                                Patient's presentation is most consistent with acute presentation with potential threat to life or bodily function.  Differential diagnosis includes, but is not limited to, drug rash, SJS/TENS, autoimmune skin rash like bullous pemphigoid, pemphigus vulgaris, tickborne disease, syphilis, HIV, other sexually  transmitted infection, cellulitis, sepsis   The patient is on the cardiac monitor to evaluate for evidence of arrhythmia and/or significant heart rate changes.  MDM:    Not sure what is causing this patient's rash but it is diffuse, more so than years prior, blistering, involving oral mucosal membranes, painful.  He otherwise  appears nontoxic and afebrile.  Broad differential diagnosis but given concern for superimposed skin infection with cellulitic changes redness and warmth, will cover with cefazolin, give IV fluids, and send off laboratory testing including STI testing, tickborne illness testing, sed rate, and blood cultures and defer further treatment until these result and can point to a more tailored approach, or with specialist consultation.  He is new to this area and has no established care so this hospitalization is especially important given social considerations and follow-up concerns.   -- Hospitalist consulted for admission, concern for TENS/SJS as he told hospitalist that he took expired Bactrim just prior to onset of symptoms.  Consultation with dermatology regarding the suspicion, consultation for further evaluation and management, disposition decision higher level of care versus admission here at [facility].  His lactic is elevated.  Given his mild tachycardia, suspicion for skin rash, meet sepsis criteria, covered with skin cefazolin coverage, complete 30 cc/kg crystalloid bolus, repeat lactic.  Unable to reach dermatology here at [facility] or [facility], discussed with our ICU doctor who recommends transfer to burn center if concern for SJS/10 as this cannot be managed at this hospital.  I spoke with burn center at [facility] who states that they will not accept the patient until biopsy-proven SJS or other criterion met that I will not be able to obtain at this time in the emergency department.  Given concern for this condition and deterioration, I will call with [facility] hospitalist for transfer  to their medicine service.     FINAL CLINICAL IMPRESSION(S) / ED DIAGNOSES   Final diagnoses:  Skin rash  Blistering  Recurrent oral ulcers  Sepsis, due to unspecified organism, unspecified whether acute organ dysfunction present (HCC)  Cellulitis of lower extremity, unspecified laterality     Rx / DC Orders   ED Discharge Orders     None        Note:  This document was prepared using Dragon voice recognition software and may include unintentional dictation errors.    Pilar Jarvis, MD 04/27/23 1546    Pilar Jarvis, MD 04/27/23 1610    Pilar Jarvis, MD 04/27/23 [id number]

## 2023-04-27 NOTE — ED Triage Notes (Signed)
Pt says he has Lyme Disease and has a "breakout" every couple of years. Friday, he started developing blisters on his hands and feet. He has ulcerations in his mouth and a rash on his feet. Pt reports the blisters are painful. Pt is not taking any medications.

## 2023-04-27 NOTE — Consult Note (Addendum)
Initial Consultation Note   Patient: Joshua Mcbride LOV:[id number] DOB: 1966-05-02 PCP: Pcp, No DOA: 04/27/2023 DOS: the patient was seen and examined on 04/27/2023 Primary service: Joshua Jarvis, MD  Referring physician: Dr. Modesto Mcbride Reason for consult: Skin rash  I have personally briefly reviewed patient's old medical records in [facility] EMR.  Chief Concern: Skin rash  HPI: Mr. Joshua Mcbride is a 57 year old male with history of depression, alcohol dependence, GERD, who presents to the emergency department for chief concerns of Lyme disease skin breakout.  Vitals in the ED showed temperature 98.5, respiration rate of 16, heart rate of 102, blood pressure 162/92, SpO2 96% on room air.  Serum sodium is 128, potassium 3.3, chloride 93, bicarb 17, BUN of 9, serum creatinine 0.78, nonfasting blood glucose 113, WBC 4.9, hemoglobin 13.8, platelets of 69.  Anion gap elevated at 18.  AST is 86, ALT 61.  UA was negative for leukocytes and nitrates.  ED treatment: Sodium chloride 1 L bolus, cefazolin 1 g IV one-time dose.  Morphine 4 mg IV one-time dose, ondansetron 4 mg IV one-time dose. ----------------------------- At bedside patient was able to tell me his name, age, occasion, current calendar year.  Reports that he thought he developed a rash on his hands and he thought it was a recurrence of his Lyme disease.  On initial questioning, he denied any medication use including antibiotic.  Patient and spouse at bedside reports that he is not on any medications.  After further questioning and discussion, he endorsed that he did take some medication that was prescribed several years ago he does not remember the name of the antibiotic.  His spouse reports that it was prescribed for him so they thought it was okay.  Spouse then remembered that it was Bactrim.  He had left over Bactrim from 2 to 3 years ago and he started taking that hold and expired Bactrim on Friday, 9/13.  He denies chest  pain, endorses some shortness of breath.  He reports the blisters started forming a couple of days ago and continued to spread and become larger.  He reports the pain started to hurt and he develops pain with swallowing.  He endorses chills.  He reports he drinks alcohol daily and had half a beer this morning.  Social history: He lives at home with his spouse.  He is a daily alcohol user, and he requires an eye opener.  ROS: Constitutional: no weight change, no fever ENT/Mouth: no sore throat, no rhinorrhea Eyes: no eye pain, no vision changes Cardiovascular: no chest pain, no dyspnea,  no edema, no palpitations Respiratory: no cough, no sputum, no wheezing Gastrointestinal: no nausea, no vomiting, no diarrhea, no constipation Genitourinary: no urinary incontinence, no dysuria, no hematuria Musculoskeletal: no arthralgias, no myalgias Skin: + skin lesions, no pruritus, + skin blister and redness, + painful skin lesions Neuro: + weakness, no loss of consciousness, no syncope Psych: no anxiety, no depression, + decrease appetite Heme/Lymph: no bruising, no bleeding  ED Course: Discussed with emergency medicine provider, patient requiring hospitalization for chief concerns of skin rash.  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   Past Medical History:  Diagnosis Date   Alcohol dependence (HCC)    History reviewed. No pertinent surgical history.  Social History:  reports that he has been smoking cigarettes. His smokeless tobacco use includes chew. He reports current alcohol use. He reports that he does not use drugs.  No Known Allergies History reviewed. No pertinent family history. Family history: Family history reviewed and not pertinent  Prior to Admission medications   Medication Sig Start Date End Date Taking? Authorizing Provider  ondansetron (ZOFRAN-ODT) 4 MG disintegrating tablet Take 1 tablet (4 mg total) by mouth every 8 (eight) hours as needed for nausea or vomiting. 03/03/23  Yes Willy Eddy, MD    Physical Exam: Vitals:   04/27/23 1730 04/27/23 1800 04/27/23 1830 04/27/23 1900  BP: (!) 170/82 (!) 166/88 (!) 157/87 (!) 166/94  Pulse: (!) 102 (!) 101 (!) 102 (!) 104  Resp: 14 15 17 13   Temp:      TempSrc:      SpO2: 96% 97% 95% 97%  Weight:      Height:       Constitutional: appears older than chronological age, acutely ill Eyes: PERRL, lids and conjunctivae normal ENMT: Mucous membranes are dry.  Mucous membrane positive for skin lesions, painful ulcers. poor dentition. Hearing appropriate Neck: normal, supple, no masses, no thyromegaly Respiratory: clear to auscultation bilaterally, no wheezing, no crackles. Normal respiratory effort. No accessory muscle use.  Cardiovascular: Regular rate and rhythm, no murmurs / rubs / gallops. No extremity edema. 2+ pedal pulses. No carotid bruits.  Abdomen: no tenderness, no masses palpated, no hepatosplenomegaly. Bowel sounds positive.  Musculoskeletal: no clubbing / cyanosis. No joint deformity upper and lower extremities. Good ROM, no contractures, no atrophy. Normal muscle tone.  Skin:          Neurologic: Sensation intact. Strength 5/5 in all 4.  Psychiatric: Normal judgment and insight. Alert and oriented x 3. Normal mood.   Labs on Admission: I have personally reviewed following labs  CBC: Recent Labs  Lab 04/27/23 1249  WBC 4.9  NEUTROABS 3.4  HGB 13.8  HCT 40.8  MCV 91.7  PLT 69*   Basic Metabolic Panel: Recent Labs  Lab 04/27/23 1249  NA 128*  K 3.3*  CL 93*  CO2 17*  GLUCOSE 113*  BUN 9  CREATININE 0.79  CALCIUM 8.7*   GFR: Estimated Creatinine Clearance: 99.2 mL/min (by C-G formula based on SCr of 0.79 mg/dL). Liver Function Tests: Recent Labs  Lab 04/27/23 1249  AST 86*  ALT 61*  ALKPHOS 63  BILITOT 1.1  PROT 7.8  ALBUMIN 4.1   Urine analysis:    Component Value Date/Time   COLORURINE YELLOW (A) 04/27/2023 1705   APPEARANCEUR CLEAR (A) 04/27/2023 1705   LABSPEC 1.020 04/27/2023 1705   PHURINE 5.0 04/27/2023 1705   GLUCOSEU NEGATIVE 04/27/2023 1705   HGBUR MODERATE (A) 04/27/2023 1705   BILIRUBINUR NEGATIVE 04/27/2023 1705   KETONESUR 80 (A) 04/27/2023 1705   PROTEINUR 30 (A) 04/27/2023 1705   NITRITE NEGATIVE 04/27/2023 1705  LEUKOCYTESUR MODERATE (A) 04/27/2023 1705   This document was prepared using Dragon Voice Recognition software and may include unintentional dictation errors.  Dr. Sedalia Muta [facility]  If 7PM-7AM, please contact overnight-coverage provider If 7AM-7PM, please contact day attending provider [email or web address]  04/27/2023, 7:25 PM

## 2023-04-27 NOTE — ED Notes (Signed)
Spoke with Rep Colton at [facility] and transport was arranged for pt. To go ED to ED to [facility].

## 2023-04-27 NOTE — Assessment & Plan Note (Signed)
Ativan 2 mg as needed for anxiety and seizure, 3 doses ordered with instruction to administer as appropriate and then let provider know.

## 2023-04-27 NOTE — Hospital Course (Signed)
Mr. Joshua Mcbride is a 57 year old male with history of depression, alcohol dependence, GERD, who presents to the emergency department for chief concerns of Lyme disease skin breakout.  Vitals in the ED showed temperature 98.5, respiration rate of 16, heart rate of 102, blood pressure 162/92, SpO2 96% on room air.  Serum sodium is 128, potassium 3.3, chloride 93, bicarb 17, BUN of 9, serum creatinine 0.78, nonfasting blood glucose 113, WBC 4.9, hemoglobin 13.8, platelets of 69.  Anion gap elevated at 18.  AST is 86, ALT 61.  UA was negative for leukocytes and nitrates.  ED treatment: Sodium chloride 1 L bolus, cefazolin 1 g IV one-time dose.  Morphine 4 mg IV one-time dose, ondansetron 4 mg IV one-time dose.

## 2023-04-28 LAB — LYME DISEASE SEROLOGY W/REFLEX: Lyme Total Antibody EIA: NEGATIVE

## 2023-04-28 LAB — RPR: RPR Ser Ql: NONREACTIVE

## 2023-04-28 LAB — HIV ANTIBODY (ROUTINE TESTING W REFLEX): HIV Screen 4th Generation wRfx: NONREACTIVE

## 2023-04-28 NOTE — ED Notes (Signed)
..  [employer]: REQUIRED DOCUMENTATION COMPLETED AND REVIEWED BY WRITER PRIOR TO PT TRANSFER MD REASSESSMENT EMTALA RN SECTION TRANSFER E-SIGN VS WITHIN REQUIRED TIME

## 2023-04-29 LAB — URINE CULTURE: Culture: NO GROWTH

## 2023-05-02 LAB — CULTURE, BLOOD (ROUTINE X 2)
Culture: NO GROWTH
Culture: NO GROWTH
Special Requests: ADEQUATE
Special Requests: ADEQUATE
